# Patient Record
Sex: Male | Born: 1965 | Race: White | Hispanic: No | Marital: Married | Smoking: Never smoker
Health system: Southern US, Community
[De-identification: ages and names within clinical notes are randomized; demographics above are authoritative.]

## PROBLEM LIST (undated history)

## (undated) DIAGNOSIS — G40909 Epilepsy, unspecified, not intractable, without status epilepticus: Secondary | ICD-10-CM

## (undated) DIAGNOSIS — G43909 Migraine, unspecified, not intractable, without status migrainosus: Secondary | ICD-10-CM

## (undated) HISTORY — DX: Epilepsy, unspecified, not intractable, without status epilepticus: G40.909

## (undated) HISTORY — DX: Migraine, unspecified, not intractable, without status migrainosus: G43.909

---

## 2005-02-14 ENCOUNTER — Emergency Department (HOSPITAL_COMMUNITY): Admission: EM | Admit: 2005-02-14 | Discharge: 2005-02-14 | Payer: Self-pay | Admitting: Emergency Medicine

## 2005-02-14 ENCOUNTER — Ambulatory Visit: Payer: Self-pay | Admitting: Cardiology

## 2005-02-15 ENCOUNTER — Ambulatory Visit: Payer: Self-pay

## 2012-12-11 ENCOUNTER — Encounter: Payer: Self-pay | Admitting: Internal Medicine

## 2012-12-11 ENCOUNTER — Ambulatory Visit (INDEPENDENT_AMBULATORY_CARE_PROVIDER_SITE_OTHER): Payer: BC Managed Care – PPO | Admitting: Internal Medicine

## 2012-12-11 VITALS — BP 112/80 | HR 82 | Temp 98.5°F | Ht 70.5 in | Wt 184.0 lb

## 2012-12-11 DIAGNOSIS — R911 Solitary pulmonary nodule: Secondary | ICD-10-CM | POA: Insufficient documentation

## 2012-12-11 DIAGNOSIS — R05 Cough: Secondary | ICD-10-CM

## 2012-12-11 DIAGNOSIS — R059 Cough, unspecified: Secondary | ICD-10-CM | POA: Insufficient documentation

## 2012-12-11 MED ORDER — AMOXICILLIN-POT CLAVULANATE 875-125 MG PO TABS: 1.0000 | ORAL_TABLET | Freq: Two times a day (BID) | ORAL | Status: AC

## 2012-12-11 MED ORDER — FAMOTIDINE 20 MG PO TABS: ORAL_TABLET | ORAL | Status: AC

## 2012-12-11 MED ORDER — PANTOPRAZOLE SODIUM 40 MG PO TBEC: 40.0000 mg | DELAYED_RELEASE_TABLET | Freq: Every day | ORAL | Status: AC

## 2012-12-11 MED ORDER — PREDNISONE (PAK) 10 MG PO TABS: ORAL_TABLET | ORAL | Status: AC

## 2012-12-11 MED ORDER — TRAMADOL HCL 50 MG PO TABS: ORAL_TABLET | ORAL | Status: AC

## 2012-12-11 NOTE — Assessment & Plan Note (Addendum)
-   See CT chest 12/01/12 4mm LL spn  [employer] guidelines reviewed > he is very low risk so needs repeat CT in one year vs no further studies.  Discussed in detail all the  indications, usual  risks and alternatives  relative to the benefits with patient who agrees to proceed with ct in one year

## 2012-12-11 NOTE — Assessment & Plan Note (Signed)
The most common causes of chronic cough in immunocompetent adults include the following: upper airway cough syndrome (UACS), previously referred to as postnasal drip syndrome (PNDS), which is caused by variety of rhinosinus conditions; (2) asthma; (3) GERD; (4) chronic bronchitis from cigarette smoking or other inhaled environmental irritants; (5) nonasthmatic eosinophilic bronchitis; and (6) bronchiectasis.   These conditions, singly or in combination, have accounted for up to 94% of the causes of chronic cough in prospective studies.   Other conditions have constituted no >6% of the causes in prospective studies These have included bronchogenic carcinoma, chronic interstitial pneumonia, sarcoidosis, left ventricular failure, ACEI-induced cough, and aspiration from a condition associated with pharyngeal dysfunction.    Chronic cough is often simultaneously caused by more than one condition. A single cause has been found from 38 to 82% of the time, multiple causes from 18 to 62%. Multiply caused cough has been the result of three diseases up to 42% of the time.       Most likley this is  Classic Upper airway cough syndrome, so named because it's frequently impossible to sort out how much is  CR/sinusitis with freq throat clearing (which can be related to primary GERD)   vs  causing  secondary (" extra esophageal")  GERD from wide swings in gastric pressure that occur with throat clearing, often  promoting self use of mint and menthol lozenges that reduce the lower esophageal sphincter tone and exacerbate the problem further in a cyclical fashion.   These are the same pts (now being labeled as having "irritable larynx syndrome" by some cough centers) who not infrequently have a history of having failed to tolerate ace inhibitors,  dry powder inhalers or biphosphonates or report having atypical reflux symptoms that don't respond to standard doses of PPI , and are easily confused as having aecopd or asthma  flares by even experienced allergists/ pulmonologists.   The cough is probably causing the abd pain which lead to the CT. Of the three most common causes of chronic cough, only one (GERD)  can actually cause the other two (asthma and post nasal drip syndrome)  and perpetuate the cylce of cough inducing airway trauma, inflammation, heightened sensitivity to reflux which is prompted by the cough itself via a cyclical mechanism.    This may partially respond to steroids and look like asthma and post nasal drainage but never erradicated completely unless the cough and the secondary reflux are eliminated, preferably both at the same time.  While not intuitively obvious, many patients with chronic low grade reflux do not cough until there is a secondary insult that disturbs the protective epithelial barrier and exposes sensitive nerve endings.  This can be viral or direct physical injury such as with an endotracheal tube.   The point is that once this occurs, it is difficult to eliminate using anything but a maximally effective acid suppression regimen at least in the short run, accompanied by an appropriate diet to address non acid GERD.   See instructions for specific recommendations which were reviewed directly with the patient who was given a copy with highlighter outlining the key components.

## 2012-12-11 NOTE — Patient Instructions (Signed)
The key to effective treatment for your cough is eliminating the non-stop cycle of cough you're stuck in long enough to let your airway heal completely and then see if there is anything still making you cough once you stop the cough suppression, but this should take no more than 5 days to figuure out  First take delsym two tsp every 12 hours and supplement if needed with  tramadol 50 mg up to 2 every 4 hours to suppress the urge to cough. Swallowing water or using ice chips/non mint and menthol containing candies (such as lifesavers or sugarless jolly ranchers) are also effective.  You should rest your voice and avoid activities that you know make you cough.  Once you have eliminated the cough for 3 straight days try reducing the tramadol first,  then the delsym as tolerated.    Try prilosec 20mg   Take 30-60 min before first meal of the day and Pepcid 20 mg one bedtime until cough is completely gone for at least a week without the need for cough suppression  I think of reflux for chronic cough like I do oxygen for fire (doesn't cause the fire but once you get the oxygen suppressed it usually goes away regardless of the exact cause).  GERD (REFLUX)  is an extremely common cause of respiratory symptoms, many times with no significant heartburn at all.    It can be treated with medication, but also with lifestyle changes including avoidance of late meals, excessive alcohol, smoking cessation, and avoid fatty foods, chocolate, peppermint, colas, red wine, and acidic juices such as orange juice.  NO MINT OR MENTHOL PRODUCTS SO NO COUGH DROPS  USE SUGARLESS CANDY INSTEAD (jolley ranchers or Stover's)  NO OIL BASED VITAMINS - use powdered substitutes.    Prednisone 10 mg take  4 each am x 2 days,   2 each am x 2 days,  1 each am x2days and stop  augmentin twice daily for 10 days  If all better,  You need a CT chest in one year (placed in tickle file)

## 2012-12-11 NOTE — Progress Notes (Signed)
  Subjective:    Patient ID: Shawn Herring, male    DOB: 10-Jul-1965, 47 y.o.   MRN: [id number]  HPI  37 yowm never smoker healthy until April 2014 with onset cough > abd pain > GI w/u neg except for benign polyps by [facility] who did CT Chest and abd > lung nodule  12/11/2012 1st pulmonary eval cc indolent onset persistent dry cough in April 2014 with fever 6/4 and dark sputum x 24h assoc with light green with tinge of blood rx with mucinex only any no better.  No obvious daytime variabilty or assoc  cp or chest tightness, subjective wheeze overt sinus or hb symptoms. No unusual exp hx or h/o childhood pna/ asthma or premature birth to his knowledge.   Sleeping ok without nocturnal  or early am exacerbation  of respiratory  c/o's or need for noct saba. Also denies any obvious fluctuation of symptoms with weather or environmental changes or other aggravating or alleviating factors except as outlined above    Review of Systems  Constitutional: Positive for appetite change. Negative for fever, chills, activity change and unexpected weight change.  HENT: Positive for congestion and sore throat. Negative for rhinorrhea, sneezing, trouble swallowing, dental problem, voice change and postnasal drip.   Eyes: Negative for visual disturbance.  Respiratory: Positive for cough and shortness of breath. Negative for choking.   Cardiovascular: Negative for chest pain and leg swelling.  Gastrointestinal: Positive for abdominal pain. Negative for nausea and vomiting.  Genitourinary: Negative for difficulty urinating.  Musculoskeletal: Negative for arthralgias.  Skin: Negative for rash.  Neurological: Positive for headaches.  Psychiatric/Behavioral: Negative for behavioral problems and confusion.       Objective:   Physical Exam  Wt Readings from Last 3 Encounters:  12/11/12 184 lb (83.462 kg)    HEENT: nl dentition, turbinates, and orophanx. Nl external ear canals without cough reflex   NECK :  without  JVD/Nodes/TM/ nl carotid upstrokes bilaterally   LUNGS: no acc muscle use, clear to A and P bilaterally with  cough on insp     CV:  RRR  no s3 or murmur or increase in P2, no edema   ABD:  soft and nontender with nl excursion in the supine position. No bruits or organomegaly, bowel sounds nl  MS:  warm without deformities, calf tenderness, cyanosis or clubbing  SKIN: warm and dry without lesions    NEURO:  alert, approp, no deficits    12/01/12  CT chest 4 mm LLL nodule       Assessment & Plan:

## 2012-12-16 ENCOUNTER — Institutional Professional Consult (permissible substitution): Payer: Self-pay | Admitting: Internal Medicine

## 2012-12-19 ENCOUNTER — Encounter: Payer: Self-pay | Admitting: Internal Medicine

## 2013-11-12 ENCOUNTER — Telehealth: Payer: Self-pay | Admitting: *Deleted

## 2013-11-12 DIAGNOSIS — R911 Solitary pulmonary nodule: Secondary | ICD-10-CM

## 2013-11-12 NOTE — Telephone Encounter (Signed)
Message copied by Christen ButterASKIN, [place] Duan M on Thu Nov 12, 2013 11:38 AM ------      Message from: Sandrea HughsWERT, MICHAEL B      Created: Thu Dec 11, 2012  4:33 PM       Ct chest s contrast eval nodule ------

## 2013-11-12 NOTE — Telephone Encounter (Signed)
[facility] w/o cm to f/u pulm nodule

## 2013-11-13 ENCOUNTER — Telehealth: Payer: Self-pay | Admitting: Internal Medicine

## 2013-11-13 NOTE — Telephone Encounter (Signed)
I had placed order for repeat ct chest to f/u pulm nodule  PCC states pt does not recall why this is needed  When I called him back to clarify this for him, he states "I finally remembered why I need this done" He states that his spouse has been dxed with "some things" and he has to post pone ct chest  I advised to call back when he is ready  Pt verbalized understanding  I will forward this to MW to make him aware

## 2013-11-13 NOTE — Telephone Encounter (Signed)
Ok to schedule whenever convenient for him no contrast needed

## 2013-11-19 ENCOUNTER — Inpatient Hospital Stay: Admission: RE | Admit: 2013-11-19 | Payer: BC Managed Care – PPO | Source: Ambulatory Visit

## 2016-10-30 ENCOUNTER — Emergency Department: Payer: Medicaid Other

## 2016-10-30 ENCOUNTER — Emergency Department
Admission: EM | Admit: 2016-10-30 | Discharge: 2016-10-30 | Disposition: A | Discharge status: Home / Self Care | Payer: Medicaid Other | Attending: Emergency Medicine | Admitting: Emergency Medicine

## 2016-10-30 DIAGNOSIS — Y999 Unspecified external cause status: Secondary | ICD-10-CM | POA: Insufficient documentation

## 2016-10-30 DIAGNOSIS — F17290 Nicotine dependence, other tobacco product, uncomplicated: Secondary | ICD-10-CM | POA: Insufficient documentation

## 2016-10-30 DIAGNOSIS — S40212A Abrasion of left shoulder, initial encounter: Secondary | ICD-10-CM | POA: Diagnosis not present

## 2016-10-30 DIAGNOSIS — S0990XA Unspecified injury of head, initial encounter: Secondary | ICD-10-CM | POA: Diagnosis present

## 2016-10-30 DIAGNOSIS — S40812A Abrasion of left upper arm, initial encounter: Secondary | ICD-10-CM | POA: Diagnosis not present

## 2016-10-30 DIAGNOSIS — S43102A Unspecified dislocation of left acromioclavicular joint, initial encounter: Secondary | ICD-10-CM | POA: Diagnosis not present

## 2016-10-30 DIAGNOSIS — Y92410 Unspecified street and highway as the place of occurrence of the external cause: Secondary | ICD-10-CM | POA: Insufficient documentation

## 2016-10-30 DIAGNOSIS — S0001XA Abrasion of scalp, initial encounter: Secondary | ICD-10-CM | POA: Insufficient documentation

## 2016-10-30 DIAGNOSIS — Y939 Activity, unspecified: Secondary | ICD-10-CM | POA: Insufficient documentation

## 2016-10-30 DIAGNOSIS — T07XXXA Unspecified multiple injuries, initial encounter: Secondary | ICD-10-CM

## 2016-10-30 MED ORDER — OXYCODONE-ACETAMINOPHEN 5-325 MG PO TABS
ORAL_TABLET | ORAL | Status: AC
  Administered 2016-10-30: 1 via ORAL
  Filled 2016-10-30: qty 1

## 2016-10-30 MED ORDER — TETANUS-DIPHTH-ACELL PERTUSSIS 5-2.5-18.5 LF-MCG/0.5 IM SUSP
0.5000 mL | Freq: Once | INTRAMUSCULAR | Status: AC
  Administered 2016-10-30: 0.5 mL via INTRAMUSCULAR
  Filled 2016-10-30: qty 0.5

## 2016-10-30 MED ORDER — OXYCODONE-ACETAMINOPHEN 5-325 MG PO TABS
1.0000 | ORAL_TABLET | Freq: Once | ORAL | Status: AC
  Administered 2016-10-30: 1 via ORAL

## 2016-10-30 MED ORDER — OXYCODONE-ACETAMINOPHEN 5-325 MG PO TABS
1.0000 | ORAL_TABLET | Freq: Once | ORAL | Status: AC
  Administered 2016-10-30: 1 via ORAL
  Filled 2016-10-30: qty 1
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
